# Patient Record
Sex: Female | Born: 1995 | Hispanic: Yes | Marital: Single | Smoking: Never smoker
Health system: Southern US, Community
[De-identification: ages and names within clinical notes are randomized; demographics above are authoritative.]

---

## 2019-10-22 ENCOUNTER — Encounter (HOSPITAL_COMMUNITY): Payer: Self-pay | Admitting: Emergency Medicine

## 2019-10-22 ENCOUNTER — Inpatient Hospital Stay (HOSPITAL_COMMUNITY)
Admission: EM | Admit: 2019-10-22 | Discharge: 2019-10-23 | Disposition: A | Discharge status: Home / Self Care | Payer: BC Managed Care – PPO | Attending: Obstetrics & Gynecology | Admitting: Obstetrics & Gynecology

## 2019-10-22 ENCOUNTER — Other Ambulatory Visit: Payer: Self-pay

## 2019-10-22 DIAGNOSIS — N938 Other specified abnormal uterine and vaginal bleeding: Secondary | ICD-10-CM | POA: Diagnosis not present

## 2019-10-22 DIAGNOSIS — N939 Abnormal uterine and vaginal bleeding, unspecified: Secondary | ICD-10-CM | POA: Diagnosis present

## 2019-10-22 DIAGNOSIS — K5900 Constipation, unspecified: Secondary | ICD-10-CM | POA: Diagnosis not present

## 2019-10-22 DIAGNOSIS — R102 Pelvic and perineal pain: Secondary | ICD-10-CM | POA: Insufficient documentation

## 2019-10-22 DIAGNOSIS — K5904 Chronic idiopathic constipation: Secondary | ICD-10-CM

## 2019-10-22 LAB — URINALYSIS, ROUTINE W REFLEX MICROSCOPIC
Bilirubin Urine: NEGATIVE
Glucose, UA: NEGATIVE mg/dL
Hgb urine dipstick: NEGATIVE
Ketones, ur: NEGATIVE mg/dL
Leukocytes,Ua: NEGATIVE
Nitrite: NEGATIVE
Protein, ur: NEGATIVE mg/dL
Specific Gravity, Urine: 1.025 (ref 1.005–1.030)
pH: 5 (ref 5.0–8.0)

## 2019-10-22 LAB — CBC
HCT: 35.3 % — ABNORMAL LOW (ref 36.0–46.0)
Hemoglobin: 11.2 g/dL — ABNORMAL LOW (ref 12.0–15.0)
MCH: 26.4 pg (ref 26.0–34.0)
MCHC: 31.7 g/dL (ref 30.0–36.0)
MCV: 83.1 fL (ref 80.0–100.0)
Platelets: 286 10*3/uL (ref 150–400)
RBC: 4.25 MIL/uL (ref 3.87–5.11)
RDW: 14.9 % (ref 11.5–15.5)
WBC: 4.9 10*3/uL (ref 4.0–10.5)
nRBC: 0 % (ref 0.0–0.2)

## 2019-10-22 LAB — COMPREHENSIVE METABOLIC PANEL
ALT: 15 U/L (ref 0–44)
AST: 18 U/L (ref 15–41)
Albumin: 4.1 g/dL (ref 3.5–5.0)
Alkaline Phosphatase: 41 U/L (ref 38–126)
Anion gap: 9 (ref 5–15)
BUN: 16 mg/dL (ref 6–20)
CO2: 23 mmol/L (ref 22–32)
Calcium: 9.3 mg/dL (ref 8.9–10.3)
Chloride: 104 mmol/L (ref 98–111)
Creatinine, Ser: 0.55 mg/dL (ref 0.44–1.00)
GFR calc Af Amer: >60 mL/min (ref >60)
GFR calc non Af Amer: >60 mL/min (ref >60)
Glucose, Bld: 92 mg/dL (ref 70–99)
Potassium: 3.9 mmol/L (ref 3.5–5.1)
Sodium: 136 mmol/L (ref 135–145)
Total Bilirubin: 0.7 mg/dL (ref 0.3–1.2)
Total Protein: 7.1 g/dL (ref 6.5–8.1)

## 2019-10-22 LAB — LIPASE, BLOOD: Lipase: 36 U/L (ref 11–51)

## 2019-10-22 LAB — I-STAT BETA HCG BLOOD, ED (MC, WL, AP ONLY): I-stat hCG, quantitative: <5 m[IU]/mL (ref <5)

## 2019-10-22 NOTE — ED Notes (Signed)
  Transport called to take patient to MAU for further treatment.   MAU Charge RN Lowella Bandy called and report given.

## 2019-10-22 NOTE — ED Triage Notes (Signed)
Pt c/o abd pain, difficult going to bathroom and vaginal discharge for the past 2 days.

## 2019-10-22 NOTE — ED Triage Notes (Signed)
Emergency Medicine Provider OB Triage Evaluation Note  Meagan Alexander is a 24 y.o. female, No obstetric history on file., at Unknown gestation who presents to the emergency department with complaints of vaginal bleeding and discharge.  The patient reports a history of constipation, which she reports is chronic.  Reports that her menstrual cycle was earlier this month, but last night she developed some spotting and this morning noted some brown vaginal discharge as well as worsening, crampy pelvic pain.  No fevers, chills, urinary frequency, URI symptoms.  Review of  Systems  Positive: Abdominal pain, pelvic pain, constipation, vaginal bleeding, vaginal discharge Negative: Fever, chills, urinary frequency or hesitancy, cough, shortness of breath  Physical Exam  BP 100/80 (BP Location: Right Arm)    Pulse 68    Temp 98.4 F (36.9 C) (Oral)    Resp 18    Ht 5\' 3"  (1.6 m)    Wt 49.9 kg    SpO2 100%    BMI 19.49 kg/m  General: Awake, no distress  HEENT: Atraumatic  Resp: Normal effort  Cardiac: Normal rate Abd: Nondistended, nontender  MSK: Moves all extremities without difficulty Neuro: Speech clear  Medical Decision Making  Pt evaluated for pregnancy concern and is stable for transfer to MAU. Pt is in agreement with plan for transfer.  11:43 PM Discussed with MAU APP, , who accepts patient in transfer.  Clinical Impression  No diagnosis found.     Wynelle Bourgeois A, PA-C 10/22/19 2343

## 2019-10-23 ENCOUNTER — Encounter (HOSPITAL_COMMUNITY): Payer: Self-pay

## 2019-10-23 DIAGNOSIS — N939 Abnormal uterine and vaginal bleeding, unspecified: Secondary | ICD-10-CM | POA: Diagnosis present

## 2019-10-23 DIAGNOSIS — N938 Other specified abnormal uterine and vaginal bleeding: Secondary | ICD-10-CM

## 2019-10-23 LAB — WET PREP, GENITAL
Clue Cells Wet Prep HPF POC: NONE SEEN
Sperm: NONE SEEN
Trich, Wet Prep: NONE SEEN
Yeast Wet Prep HPF POC: NONE SEEN

## 2019-10-23 LAB — GC/CHLAMYDIA PROBE AMP (~~LOC~~) NOT AT ARMC
Chlamydia: NEGATIVE
Comment: NEGATIVE
Comment: NORMAL
Neisseria Gonorrhea: NEGATIVE

## 2019-10-23 MED ORDER — IBUPROFEN 600 MG PO TABS: 600.0000 mg | ORAL_TABLET | Freq: Four times a day (QID) | ORAL | 0 refills | Status: DC | PRN

## 2019-10-23 MED ORDER — IBUPROFEN 600 MG PO TABS
600.0000 mg | ORAL_TABLET | Freq: Once | ORAL | Status: AC
  Administered 2019-10-23: 600 mg via ORAL
  Filled 2019-10-23: qty 1

## 2019-10-23 MED ORDER — POLYETHYLENE GLYCOL 3350 17 G PO PACK: 17.0000 g | PACK | Freq: Every day | ORAL | 0 refills | Status: AC

## 2019-10-23 NOTE — MAU Provider Note (Signed)
Chief Complaint:  Abdominal Pain   First Provider Initiated Contact with Patient 10/23/19 0030     GYN  HPI: Meagan Alexander is a 24 y.o. No obstetric history on file. who presents to maternity admissions reporting pelvic pain for 2 days, constipation, midcycle bleeding.  Not on contraception.  States last IC was a month ago.  Usually has regular periods. Constipation is chronic.  Marland Kitchen She reports vaginal bleeding, but no vaginal itching/burning, urinary symptoms, h/a, dizziness, n/v, or fever/chills.    Abdominal Pain This is a new problem. The current episode started in the past 7 days. The onset quality is gradual. The problem occurs constantly. The problem has been unchanged. The pain is located in the LLQ, RLQ and suprapubic region. The pain is mild. The quality of the pain is cramping. The abdominal pain does not radiate. Associated symptoms include constipation. Pertinent negatives include no anorexia, diarrhea, dysuria, fever, frequency, myalgias, nausea or vomiting. Nothing aggravates the pain. The pain is relieved by nothing. She has tried nothing for the symptoms.    ED Provider Note: [name] is a 24 y.o. female, No obstetric history on file., at Unknown gestation who presents to the emergency department with complaints of vaginal bleeding and discharge.  The patient reports a history of constipation, which she reports is chronic.  Reports that her menstrual cycle was earlier this month, but last night she developed some spotting and this morning noted some brown vaginal discharge as well as worsening, crampy pelvic pain.  No fevers, chills, urinary frequency, URI symptoms.  RN Note: Pt c/o abd pain, difficult going to bathroom and vaginal discharge for the past 2 days  Past Medical History: History reviewed. No pertinent past medical history.  Past obstetric history: OB History  No obstetric history on file.    Past Surgical History: History reviewed. No  pertinent surgical history.  Family History: No family history on file.  Social History: Social History   Tobacco Use  . Smoking status: Never Smoker  . Smokeless tobacco: Never Used  Vaping Use  . Vaping Use: Never used  Substance Use Topics  . Alcohol use: Never  . Drug use: Never    Allergies: No Known Allergies  Meds:  No medications prior to admission.    I have reviewed patient's Past Medical Hx, Surgical Hx, Family Hx, Social Hx, medications and allergies.  ROS:  Review of Systems  Constitutional: Negative for fever.  Gastrointestinal: Positive for abdominal pain and constipation. Negative for anorexia, diarrhea, nausea and vomiting.  Genitourinary: Negative for dysuria and frequency.  Musculoskeletal: Negative for myalgias.   Other systems negative   Physical Exam   Patient Vitals for the past 24 hrs:  BP Temp Temp src Pulse Resp SpO2 Height Weight  10/23/19 0002 113/74 97.9 F (36.6 C) Oral 68 16 -- -- --  10/22/19 2357 -- -- -- -- -- -- -- 49.9 kg  10/22/19 2238 100/80 -- -- 68 18 100 % -- --  10/22/19 1739 -- -- -- -- -- -- 5\' 3"  (1.6 m) 49.9 kg  10/22/19 1728 109/78 98.4 F (36.9 C) Oral 80 18 100 % 5\' 3"  (1.6 m) 49.9 kg   Constitutional: Well-developed, well-nourished female in no acute distress.  Cardiovascular: normal rate and rhythm Respiratory: normal effort, no distress.  GI: Abd soft, non-tender.  Nondistended.  No rebound, No guarding.   MS: Extremities nontender, no edema, normal ROM Neurologic: Alert and oriented x 4.   Grossly nonfocal. GU:  Neg CVAT. Skin:  Warm and Dry Psych:  Affect appropriate.  PELVIC EXAM: Cervix pink, visually closed, without lesion, scant white creamy discharge, vaginal walls and external genitalia normal Bimanual exam: Cervix firm, anterior, neg CMT, uterus nontender, nonenlarged, adnexa without tenderness, enlargement, or mass  Exam is limited by body habitus.    Labs: Results for orders placed or  performed during the hospital encounter of 10/22/19 (from the past 24 hour(s))  Lipase, blood     Status: None   Collection Time: 10/22/19  5:41 PM  Result Value Ref Range   Lipase 36 11 - 51 U/L  Comprehensive metabolic panel     Status: None   Collection Time: 10/22/19  5:41 PM  Result Value Ref Range   Sodium 136 135 - 145 mmol/L   Potassium 3.9 3.5 - 5.1 mmol/L   Chloride 104 98 - 111 mmol/L   CO2 23 22 - 32 mmol/L   Glucose, Bld 92 70 - 99 mg/dL   BUN 16 6 - 20 mg/dL   Creatinine, Ser 3.41 0.44 - 1.00 mg/dL   Calcium 9.3 8.9 - 96.2 mg/dL   Total Protein 7.1 6.5 - 8.1 g/dL   Albumin 4.1 3.5 - 5.0 g/dL   AST 18 15 - 41 U/L   ALT 15 0 - 44 U/L   Alkaline Phosphatase 41 38 - 126 U/L   Total Bilirubin 0.7 0.3 - 1.2 mg/dL   GFR calc non Af Amer >60 >60 mL/min   GFR calc Af Amer >60 >60 mL/min   Anion gap 9 5 - 15  CBC     Status: Abnormal   Collection Time: 10/22/19  5:41 PM  Result Value Ref Range   WBC 4.9 4.0 - 10.5 K/uL   RBC 4.25 3.87 - 5.11 MIL/uL   Hemoglobin 11.2 (L) 12.0 - 15.0 g/dL   HCT 22.9 (L) 36 - 46 %   MCV 83.1 80.0 - 100.0 fL   MCH 26.4 26.0 - 34.0 pg   MCHC 31.7 30.0 - 36.0 g/dL   RDW 79.8 92.1 - 19.4 %   Platelets 286 150 - 400 K/uL   nRBC 0.0 0.0 - 0.2 %  I-Stat beta hCG blood, ED     Status: None   Collection Time: 10/22/19  6:00 PM  Result Value Ref Range   I-stat hCG, quantitative <5.0 <5 mIU/mL   Comment 3          Urinalysis, Routine w reflex microscopic Urine, Clean Catch     Status: None   Collection Time: 10/22/19  7:53 PM  Result Value Ref Range   Color, Urine YELLOW YELLOW   APPearance CLEAR CLEAR   Specific Gravity, Urine 1.025 1.005 - 1.030   pH 5.0 5.0 - 8.0   Glucose, UA NEGATIVE NEGATIVE mg/dL   Hgb urine dipstick NEGATIVE NEGATIVE   Bilirubin Urine NEGATIVE NEGATIVE   Ketones, ur NEGATIVE NEGATIVE mg/dL   Protein, ur NEGATIVE NEGATIVE mg/dL   Nitrite NEGATIVE NEGATIVE   Leukocytes,Ua NEGATIVE NEGATIVE  Wet prep, genital      Status: Abnormal   Collection Time: 10/23/19 12:26 AM   Specimen: Vaginal  Result Value Ref Range   Yeast Wet Prep HPF POC NONE SEEN NONE SEEN   Trich, Wet Prep NONE SEEN NONE SEEN   Clue Cells Wet Prep HPF POC NONE SEEN NONE SEEN   WBC, Wet Prep HPF POC MANY (A) NONE SEEN   Sperm NONE SEEN    Imaging:  No  results found.  MAU Course/MDM: I have ordered labs as follows:  GC/Chlamydia, Wet prep, UA  All are negative for infection Imaging ordered: none indicated Results reviewed.    Treatments in MAU included ibuprofen for cramping  Discussed the irregular bleeding and cramping are likely result of an anovulatory cycle or less than optimal egg last cycle.  Pain can also be related to chronic constipation.  Recommend waiting 1-2 more cycles to see if normal cycles resume Miralax and daily fiber for constipation .   Pt stable at time of discharge.  Assessment: Pelvic pain Constipation Dysfunctional Uterine bleeding  Plan: Discharge home Recommend ibuprofen for pain and to help bleeding, miralax for constipation.  Discussed cycles will likely regulate themselves without intervention Discussed she can go to HD or GYN for contraception Rx sent for ibuprofen  for pain Rx Miralax for constipation Recommend daily fiber  Encouraged to return here or to other Urgent Care/ED if she develops worsening of symptoms, increase in pain, fever, or other concerning symptoms.   Wynelle Bourgeois CNM, MSN Certified Nurse-Midwife 10/23/2019 12:30 AM

## 2019-10-23 NOTE — MAU Note (Signed)
.   Meagan Alexander is a 24 y.o. GYN here in MAU reporting: abdominal pain that started yesterday. Patient states that she has a lot of constipation issues. She states she had a period x2 weeks ago but also started spotting again last night. Her period is usually very regular so when she had an increase in vaginal discharge and bleeding she wanted to be sure everything was fine.   Pain score: 4 Vitals:   10/22/19 2238 10/23/19 0002  BP: 100/80 113/74  Pulse: 68 68  Resp: 18 16  Temp:  97.9 F (36.6 C)  SpO2: 100%

## 2019-10-23 NOTE — Discharge Instructions (Signed)
Abnormal Uterine Bleeding Abnormal uterine bleeding means bleeding more than usual from your uterus. It can include:  Bleeding between periods.  Bleeding after sex.  Bleeding that is heavier than normal.  Periods that last longer than usual.  Bleeding after you have stopped having your period (menopause). There are many problems that may cause this. You should see a doctor for any kind of bleeding that is not normal. Treatment depends on the cause of the bleeding. Follow these instructions at home:  Watch your condition for any changes.  Do not use tampons, douche, or have sex, if your doctor tells you not to.  Change your pads often.  Get regular well-woman exams. Make sure they include a pelvic exam and cervical cancer screening.  Keep all follow-up visits as told by your doctor. This is important. Contact a doctor if:  The bleeding lasts more than one week.  You feel dizzy at times.  You feel like you are going to throw up (nauseous).  You throw up. Get help right away if:  You pass out.  You have to change pads every hour.  You have belly (abdominal) pain.  You have a fever.  You get sweaty.  You get weak.  You passing large blood clots from your vagina. Summary  Abnormal uterine bleeding means bleeding more than usual from your uterus.  There are many problems that may cause this. You should see a doctor for any kind of bleeding that is not normal.  Treatment depends on the cause of the bleeding. This information is not intended to replace advice given to you by your health care provider. Make sure you discuss any questions you have with your health care provider. Document Revised: 01/03/2016 Document Reviewed: 01/03/2016 Elsevier Patient Education  2020 [employer]   Chronic Constipation  Chronic constipation is a condition in which a person has three or fewer bowel movements a week, for three months or longer. This condition is especially common  in older adults. The two main kinds of chronic constipation are secondary constipation and functional constipation. Secondary constipation results from another condition or a treatment. Functional constipation, also called primary or idiopathic constipation, is divided into three types:  Normal transit constipation. In this type, movement of stool through the colon (stool transit) occurs normally.  Slow transit constipation. In this type, stool moves slowly through the colon.  Outlet constipation or pelvic floor dysfunction. In this type, the nerves and muscles that empty the rectum do not work normally. What are the causes? Causes of secondary constipation may include:  Failing to drink enough fluid, eat enough food or fiber, or get physically active.  Pregnancy.  A tear in the anus (anal fissure).  Blockage in the bowel (bowel obstruction).  Narrowing of the bowel (bowel stricture).  Having a long-term medical condition, such as: ? Diabetes. ? Hypothyroidism. ? Multiple sclerosis. ? Parkinson disease. ? Stroke. ? Spinal cord injury. ? Dementia. ? Colon cancer. ? Inflammatory bowel disease (IBD). ? Iron-deficiency anemia. ? Outward collapse of the rectum (rectal prolapse). ? Hemorrhoids.  Taking certain medicines, including: ? Narcotics. These are a certain type of prescription pain medicine. ? Antacids. ? Iron supplements. ? Water pills (diuretics). ? Certain blood pressure medicines. ? Anti-seizure medicines. ? Antidepressants. ? Medicines for Parkinson disease. The cause of functional constipation is not known, but some conditions are associated with it. These conditions include:  Stress.  Problems in the nerves and muscles that control stool transit.  Weak or impaired  pelvic floor muscles. What increases the risk? You may be at higher risk for chronic constipation if you:  Are older than age 73.  Are female.  Live in a long-term care facility.  Do not  get much exercise or physical activity (have a sedentary lifestyle).  Do not drink enough fluids.  Do not eat enough food, especially fiber.  Have a long-term disease.  Have a mental health disorder or eating disorder.  Take many medicines. What are the signs or symptoms? The main symptom of chronic constipation is having three or fewer bowel movements a week for several weeks. Other signs and symptoms may vary from person to person. These include:  Pushing hard (straining) to pass stool.  Painful bowel movements.  Having hard or lumpy stools.  Having lower belly discomfort, such as cramps or bloating.  Being unable to have a bowel movement when you feel the urge.  Feeling like you still need to pass stool after a bowel movement.  Feeling that you have something in your rectum that is blocking or preventing bowel movements.  Seeing blood on the toilet paper or in your stool.  Worsening confusion (in older adults). How is this diagnosed? This condition may be diagnosed based on:  Symptoms and medical history. You will be asked about your symptoms, lifestyle, diet, and any medicines that you are taking.  Physical exam. ? Your belly (abdomen) will be examined. ? A digital rectal exam may be done. For this exam, a health care provider places a lubricated, gloved finger into the rectum.  Other tests to check for any underlying causes of your constipation. These may be ordered if you have bleeding in your rectum, weight loss, or a family history of colon cancer. In these cases, you may have: ? Imaging studies of the colon. These may include X-ray, ultrasound, or CT scan. ? Blood tests. ? A procedure to examine the inside of your colon (colonoscopy). ? More specialized tests to check:  Whether your anal sphincter works well. This is a ring-shaped muscle that controls the closing of the anus.  How well food moves through your colon. ? Tests to measure the nerve signal in your  pelvic floor muscles (electromyography). How is this treated? Treatment for chronic constipation depends on the cause. Most often, treatment starts with:  Being more active and getting regular exercise.  Drinking more fluids.  Adding fiber to your diet. Sources of fiber include fruits, vegetables, whole grains, and fiber supplements.  Using medicines such as stool softeners or medicines that increase contractions in your digestive system (pro-motility agents).  Training your pelvic muscles with biofeedback.  Surgery, if there is obstruction. Treatment for secondary chronic constipation depends on the underlying condition. You may need to:  Stop or change some medicines if they cause constipation.  Use a fiber supplement (bulk laxative) or stool softener.  Use prescription laxative. This works by [employer] into your colon (osmotic laxative). You may also need to see a specialist who treats conditions of the digestive system (gastroenterologist). Follow these instructions at home:   Take over-the-counter and prescription medicines only as told by your health care provider.  If you are taking a laxative, take it as told by your health care provider.  Eat a balanced diet that includes enough fiber. Ask your health care provider to recommend a diet that is right for you.  Drink clear fluids, especially water. Avoid drinking alcohol, caffeine, and soda.  Drink enough fluid to keep your urine  pale yellow.  Get some physical activity every day. Ask your health care provider what physical activities are safe for you.  Get colon cancer screenings as told by your health care provider.  Keep all follow-up visits as told by your health care provider. This is important. Contact a health care provider if:  You are having three or fewer bowel movements a week.  Your stools are hard or lumpy.  You notice blood on the toilet paper or in your stool after you have a bowel  movement.  You have unexplained weight loss.  You have rectum (rectal) pain.  You have stool leakage.  You experience nausea or vomiting. Get help right away if:  You have rectal bleeding or you pass blood clots.  You have severe rectal pain.  You have body tissue that pushes out (protrudes) from your anus.  You have severe pain or bloating (distension) in your abdomen.  You have vomiting that you cannot control. Summary  Chronic constipation is a condition in which a person has three or fewer bowel movements a week, for three months or longer.  You may have a higher risk for this condition if you are an older adult, or if you do not drink enough water or get enough physical activity (are sedentary).  Treatment for this condition depends on the cause. Most treatments for chronic constipation include adding fiber to your diet, drinking more fluids, and getting more physical activity. You may also need to treat any underlying medical conditions or stop or change certain medicines if they cause constipation.  If lifestyle changes do not relieve constipation, your health care provider may recommend taking a laxative. This information is not intended to replace advice given to you by your health care provider. Make sure you discuss any questions you have with your health care provider. Document Revised: 12/21/2016 Document Reviewed: 09/25/2016 Elsevier Patient Education  2020 [employer].

## 2020-06-29 ENCOUNTER — Other Ambulatory Visit: Payer: Self-pay

## 2020-06-29 ENCOUNTER — Emergency Department (HOSPITAL_COMMUNITY): Payer: BC Managed Care – PPO

## 2020-06-29 ENCOUNTER — Encounter (HOSPITAL_COMMUNITY): Payer: Self-pay | Admitting: Emergency Medicine

## 2020-06-29 ENCOUNTER — Emergency Department (HOSPITAL_COMMUNITY)
Admission: EM | Admit: 2020-06-29 | Discharge: 2020-06-30 | Disposition: A | Discharge status: Home / Self Care | Payer: BC Managed Care – PPO | Attending: Emergency Medicine | Admitting: Emergency Medicine

## 2020-06-29 DIAGNOSIS — R102 Pelvic and perineal pain: Secondary | ICD-10-CM | POA: Diagnosis present

## 2020-06-29 DIAGNOSIS — R112 Nausea with vomiting, unspecified: Secondary | ICD-10-CM | POA: Insufficient documentation

## 2020-06-29 DIAGNOSIS — R42 Dizziness and giddiness: Secondary | ICD-10-CM

## 2020-06-29 LAB — CBC
HCT: 38 % (ref 36.0–46.0)
Hemoglobin: 12 g/dL (ref 12.0–15.0)
MCH: 27.1 pg (ref 26.0–34.0)
MCHC: 31.6 g/dL (ref 30.0–36.0)
MCV: 86 fL (ref 80.0–100.0)
Platelets: 294 10*3/uL (ref 150–400)
RBC: 4.42 MIL/uL (ref 3.87–5.11)
RDW: 14.8 % (ref 11.5–15.5)
WBC: 6.8 10*3/uL (ref 4.0–10.5)
nRBC: 0 % (ref 0.0–0.2)

## 2020-06-29 LAB — URINALYSIS, ROUTINE W REFLEX MICROSCOPIC
Bacteria, UA: NONE SEEN
Bilirubin Urine: NEGATIVE
Glucose, UA: NEGATIVE mg/dL
Ketones, ur: NEGATIVE mg/dL
Nitrite: NEGATIVE
Protein, ur: NEGATIVE mg/dL
Specific Gravity, Urine: 1.025 (ref 1.005–1.030)
pH: 7 (ref 5.0–8.0)

## 2020-06-29 LAB — I-STAT BETA HCG BLOOD, ED (MC, WL, AP ONLY): I-stat hCG, quantitative: <5 m[IU]/mL (ref <5)

## 2020-06-29 LAB — COMPREHENSIVE METABOLIC PANEL
ALT: 11 U/L (ref 0–44)
AST: 16 U/L (ref 15–41)
Albumin: 4.3 g/dL (ref 3.5–5.0)
Alkaline Phosphatase: 38 U/L (ref 38–126)
Anion gap: 6 (ref 5–15)
BUN: 20 mg/dL (ref 6–20)
CO2: 24 mmol/L (ref 22–32)
Calcium: 9 mg/dL (ref 8.9–10.3)
Chloride: 107 mmol/L (ref 98–111)
Creatinine, Ser: 0.86 mg/dL (ref 0.44–1.00)
GFR, Estimated: >60 mL/min (ref >60)
Glucose, Bld: 99 mg/dL (ref 70–99)
Potassium: 3.7 mmol/L (ref 3.5–5.1)
Sodium: 137 mmol/L (ref 135–145)
Total Bilirubin: 0.4 mg/dL (ref 0.3–1.2)
Total Protein: 7.3 g/dL (ref 6.5–8.1)

## 2020-06-29 LAB — LIPASE, BLOOD: Lipase: 42 U/L (ref 11–51)

## 2020-06-29 MED ORDER — ONDANSETRON HCL 4 MG/2ML IJ SOLN
4.0000 mg | Freq: Once | INTRAMUSCULAR | Status: AC
  Administered 2020-06-29: 4 mg via INTRAVENOUS

## 2020-06-29 MED ORDER — SODIUM CHLORIDE 0.9 % IV BOLUS
1000.0000 mL | Freq: Once | INTRAVENOUS | Status: AC
  Administered 2020-06-29: 1000 mL via INTRAVENOUS

## 2020-06-29 NOTE — ED Provider Notes (Signed)
[facility]-EMERGENCY DEPT Provider Note   CSN: [id number] Arrival date & time: 06/29/20  2045     History Chief Complaint  Patient presents with   Abdominal Pain    Meagan Alexander is a 25 y.o. female.  Patient presents to the ED with a chief complaint of pelvic pain and dizziness.  She states that she began her cycle today.  She reports light bleeding.  She reports associated nausea and vomiting.  She states that she has never experienced dizziness/lightheadedness like this before with her cycle.  She reports moderate pelvic pain.  She states this is worse than normal.  She denies any treatment prior to arrival.  The history is provided by the patient. No language interpreter was used.      History reviewed. No pertinent past medical history.  Patient Active Problem List   Diagnosis Date Noted   Abnormal uterine bleeding (AUB) 10/23/2019    History reviewed. No pertinent surgical history.   OB History   No obstetric history on file.     History reviewed. No pertinent family history.  Social History   Tobacco Use   Smoking status: Never Smoker   Smokeless tobacco: Never Used  Vaping Use   Vaping Use: Never used  Substance Use Topics   Alcohol use: Never   Drug use: Never    Home Medications Prior to Admission medications   Medication Sig Start Date End Date Taking? Authorizing Provider  ibuprofen (ADVIL) 600 MG tablet Take 1 tablet (600 mg total) by mouth every 6 (six) hours as needed. 10/23/19   Aviva Signs, CNM  polyethylene glycol (MIRALAX) 17 g packet Take 17 g by mouth daily. 10/23/19   Aviva Signs, CNM    Allergies    Patient has no known allergies.  Review of Systems   Review of Systems  Gastrointestinal:  Positive for abdominal pain.  All other systems reviewed and are negative.  Physical Exam Updated Vital Signs BP 105/80 (BP Location: Left Arm)   Pulse 65   Temp 99.5 F (37.5 C) (Oral)   Resp 16    Ht 5\' 3"  (1.6 m)   Wt 51.1 kg   LMP 06/29/2020   SpO2 100%   BMI 19.96 kg/m   Physical Exam Vitals and nursing note reviewed.  Constitutional:      General: She is not in acute distress.    Appearance: She is well-developed.  HENT:     Head: Normocephalic and atraumatic.  Eyes:     Conjunctiva/sclera: Conjunctivae normal.  Cardiovascular:     Rate and Rhythm: Normal rate and regular rhythm.     Heart sounds: No murmur heard. Pulmonary:     Effort: Pulmonary effort is normal. No respiratory distress.     Breath sounds: Normal breath sounds.  Abdominal:     Palpations: Abdomen is soft.     Tenderness: There is abdominal tenderness.     Comments: Moderate suprapubic tenderness  Musculoskeletal:        General: Normal range of motion.     Cervical back: Neck supple.  Skin:    General: Skin is warm and dry.  Neurological:     Mental Status: She is alert.  Psychiatric:        Mood and Affect: Mood normal.        Behavior: Behavior normal.    ED Results / Procedures / Treatments   Labs (all labs ordered are listed, but only abnormal results are displayed)  Labs Reviewed  URINALYSIS, ROUTINE W REFLEX MICROSCOPIC - Abnormal; Notable for the following components:      Result Value   APPearance HAZY (*)    Hgb urine dipstick MODERATE (*)    Leukocytes,Ua TRACE (*)    All other components within normal limits  LIPASE, BLOOD  COMPREHENSIVE METABOLIC PANEL  CBC  I-STAT BETA HCG BLOOD, ED ([facility], [facility], [facility] ONLY)    EKG None  Radiology No results found.  Procedures Procedures   Medications Ordered in ED Medications  sodium chloride 0.9 % bolus 1,000 mL (has no administration in time range)  ondansetron (ZOFRAN) injection 4 mg (has no administration in time range)    ED Course  I have reviewed the triage vital signs and the nursing notes.  Pertinent labs & imaging results that were available during my care of the patient were reviewed by me and considered in my medical  decision making (see chart for details).    MDM Rules/Calculators/A&P                          Patient here with pelvic pain.  She just started her menstrual cycle.  She also reports feeling somewhat dizzy.  She is not hypotensive.  She is not anemic.  She is not pregnant.  Glucose is normal.  Laboratory work-up is reassuring.  Ultrasound of the pelvis shows no acute abnormality.  She does not appear to be in any acute distress.  I do not feel that she requires any further emergent work-up or treatment in the emergency department tonight.  Feel that she is stable for discharge and close follow-up with her PCP/OB/GYN.  She feels improved after receiving a liter of fluid.   Final Clinical Impression(s) / ED Diagnoses Final diagnoses:  Pelvic pain in female  Dizziness    Rx / DC Orders ED Discharge Orders     None        Roxy Horseman, PA-C 06/30/20 0973    Geoffery Lyons, MD 06/30/20 [phone]

## 2020-06-29 NOTE — ED Triage Notes (Signed)
Patient complaining of lower abdominal pain and dizziness that started today.

## 2020-08-02 ENCOUNTER — Other Ambulatory Visit: Payer: Self-pay

## 2020-08-02 ENCOUNTER — Emergency Department (HOSPITAL_COMMUNITY)
Admission: EM | Admit: 2020-08-02 | Discharge: 2020-08-03 | Disposition: A | Discharge status: Home / Self Care | Payer: BC Managed Care – PPO | Attending: Emergency Medicine | Admitting: Emergency Medicine

## 2020-08-02 ENCOUNTER — Encounter (HOSPITAL_COMMUNITY): Payer: Self-pay | Admitting: Emergency Medicine

## 2020-08-02 DIAGNOSIS — B3731 Acute candidiasis of vulva and vagina: Secondary | ICD-10-CM

## 2020-08-02 DIAGNOSIS — R102 Pelvic and perineal pain: Secondary | ICD-10-CM | POA: Insufficient documentation

## 2020-08-02 DIAGNOSIS — B373 Candidiasis of vulva and vagina: Secondary | ICD-10-CM | POA: Insufficient documentation

## 2020-08-02 LAB — PREGNANCY, URINE: Preg Test, Ur: NEGATIVE

## 2020-08-02 MED ORDER — IBUPROFEN 200 MG PO TABS
600.0000 mg | ORAL_TABLET | Freq: Once | ORAL | Status: DC
Start: 2020-08-03 — End: 2020-08-03

## 2020-08-02 NOTE — ED Triage Notes (Signed)
Patient presents with pelvic pain that began Saturday. Patient is on a course of macrobid for a UTI.

## 2020-08-02 NOTE — ED Provider Notes (Signed)
Emergency Medicine Provider Triage Evaluation Note  Meagan Alexander , a 25 y.o. female  was evaluated in triage.  Pt complains of pelvic pain.  This has been going on for a week.  She has been taking Macrobid for UTI.  Has not taken any other medications for pain.  Review of Systems  Positive: Pelvic pain Negative: Vaginal bleeding, vaginal discharge, fever, vomiting, dysuria  Physical Exam  BP 100/65 (BP Location: Right Arm)   Pulse 78   Temp 98.5 F (36.9 C) (Oral)   Resp 18   Ht 5\' 3"  (1.6 m)   Wt 52.2 kg   SpO2 99%   BMI 20.37 kg/m  Gen:   Awake, no distress   Resp:  Normal effort  MSK:   Moves extremities without difficulty  Other:  Abdomen is soft  Medical Decision Making  Medically screening exam initiated at 11:26 PM.  Appropriate orders placed.  Meagan Alexander was informed that the remainder of the evaluation will be completed by another provider, this initial triage assessment does not replace that evaluation, and the importance of remaining in the ED until their evaluation is complete.  Will check pregnancy test will likely need pain control   Therese Sarah, PA-C 08/02/20 2327    [id number], MD 08/02/20 [phone]

## 2020-08-03 ENCOUNTER — Emergency Department (HOSPITAL_COMMUNITY): Payer: BC Managed Care – PPO

## 2020-08-03 ENCOUNTER — Encounter (HOSPITAL_COMMUNITY): Payer: Self-pay | Admitting: Student

## 2020-08-03 LAB — WET PREP, GENITAL
Sperm: NONE SEEN
Trich, Wet Prep: NONE SEEN

## 2020-08-03 LAB — URINALYSIS, ROUTINE W REFLEX MICROSCOPIC
Bilirubin Urine: NEGATIVE
Glucose, UA: NEGATIVE mg/dL
Ketones, ur: NEGATIVE mg/dL
Nitrite: NEGATIVE
Protein, ur: NEGATIVE mg/dL
Specific Gravity, Urine: 1.011 (ref 1.005–1.030)
pH: 5 (ref 5.0–8.0)

## 2020-08-03 LAB — GC/CHLAMYDIA PROBE AMP (~~LOC~~) NOT AT ARMC
Chlamydia: NEGATIVE
Comment: NEGATIVE
Comment: NORMAL
Neisseria Gonorrhea: NEGATIVE

## 2020-08-03 MED ORDER — NAPROXEN 375 MG PO TABS
375.0000 mg | ORAL_TABLET | Freq: Once | ORAL | Status: AC
  Administered 2020-08-03: 375 mg via ORAL
  Filled 2020-08-03: qty 1

## 2020-08-03 MED ORDER — NAPROXEN 500 MG PO TABS: 500.0000 mg | ORAL_TABLET | Freq: Two times a day (BID) | ORAL | 0 refills | Status: AC | PRN

## 2020-08-03 MED ORDER — FLUCONAZOLE 150 MG PO TABS
150.0000 mg | ORAL_TABLET | Freq: Once | ORAL | Status: AC
  Administered 2020-08-03: 150 mg via ORAL
  Filled 2020-08-03: qty 1

## 2020-08-03 MED ORDER — KETOROLAC TROMETHAMINE 15 MG/ML IJ SOLN: 15.0000 mg | Freq: Once | INTRAMUSCULAR | Status: DC

## 2020-08-03 NOTE — ED Provider Notes (Signed)
[facility]-EMERGENCY DEPT Provider Note   CSN: [id number] Arrival date & time: 08/02/20  2221     History Chief Complaint  Patient presents with   Pelvic Pain    Meagan Alexander is a 25 y.o. female who presents to the ED with complaints of pelvic pain for the past 6 days.  Patient states that she has had intermittent pelvic cramping for the past 6 days, no specific triggers or alleviating or aggravating factors.  She was seen at urgent care and was told she had a UTI, she was placed on Macrobid which she has been taking without improvement in her symptoms.  She recently started birth control.  She is currently sexually active with 1 partner without concern for STI.  She denies fever, nausea, vomiting, atypical vaginal discharge, vaginal bleeding, dysuria, frequency, urgency, or diarrhea.  HPI     History reviewed. No pertinent past medical history.  Patient Active Problem List   Diagnosis Date Noted   Abnormal uterine bleeding (AUB) 10/23/2019    History reviewed. No pertinent surgical history.   OB History   No obstetric history on file.     No family history on file.  Social History   Tobacco Use   Smoking status: Never   Smokeless tobacco: Never  Vaping Use   Vaping Use: Never used  Substance Use Topics   Alcohol use: Never   Drug use: Never    Home Medications Prior to Admission medications   Medication Sig Start Date End Date Taking? Authorizing Provider  ibuprofen (ADVIL) 600 MG tablet Take 1 tablet (600 mg total) by mouth every 6 (six) hours as needed. 10/23/19   Aviva Signs, CNM  polyethylene glycol (MIRALAX) 17 g packet Take 17 g by mouth daily. 10/23/19   Aviva Signs, CNM    Allergies    Patient has no known allergies.  Review of Systems   Review of Systems Constitutional:  Negative for chills and fever. Respiratory:  Negative for shortness of breath.   Cardiovascular:  Negative for chest  pain. Gastrointestinal:  Negative for diarrhea, nausea and vomiting. Genitourinary:  Positive for pelvic pain. Negative for dysuria, vaginal bleeding and vaginal discharge.  All other systems reviewed and are negative. Physical Exam Updated Vital Signs BP 100/65 (BP Location: Right Arm)   Pulse 78   Temp 98.5 F (36.9 C) (Oral)   Resp 18   Ht 5\' 3"  (1.6 m)   Wt 52.2 kg   SpO2 99%   BMI 20.37 kg/m   Physical Exam Vitals and nursing note reviewed. Constitutional:      General: She is not in acute distress.    Appearance: She is well-developed. She is not toxic-appearing. HENT:    Head: Normocephalic and atraumatic. Eyes:    General:        Right eye: No discharge.        Left eye: No discharge.    Conjunctiva/sclera: Conjunctivae normal. Cardiovascular:    Rate and Rhythm: Normal rate and regular rhythm. Pulmonary:    Effort: Pulmonary effort is normal. No respiratory distress.    Breath sounds: Normal breath sounds. No wheezing, rhonchi or rales. Abdominal:    General: There is no distension.    Palpations: Abdomen is soft.    Tenderness: There is no abdominal tenderness. There is no guarding or rebound. Genitourinary:    Comments: NP present as chaperone.  No external lesions.  There is white to yellow-colored discharge present in the vaginal  canal.  Diffuse tenderness with bimanual exam, no focal cervical motion tenderness.  No adnexal masses palpated Musculoskeletal:    Cervical back: Neck supple. Skin:    General: Skin is warm and dry.    Findings: No rash. Neurological:    Mental Status: She is alert.    Comments: Clear speech.   Psychiatric:        Behavior: Behavior normal.   ED Results / Procedures / Treatments   Labs (all labs ordered are listed, but only abnormal results are displayed) Labs Reviewed  WET PREP, GENITAL  PREGNANCY, URINE  GC/CHLAMYDIA PROBE AMP ([place]) NOT AT [facility]    EKG None  Radiology No results  found.  Procedures Procedures   Medications Ordered in ED Medications  ibuprofen (ADVIL) tablet 600 mg (has no administration in time range)    ED Course  I have reviewed the triage vital signs and the nursing notes.  Pertinent labs & imaging results that were available during my care of the patient were reviewed by me and considered in my medical decision making (see chart for details).    MDM Rules/Calculators/A&P                          Patient presents to the ED with complaints of pelvic pain.  Resting comfortably, vitals without significant abnormality.  On exam patient does have diffuse tenderness with bimanual, no specific cervical motion tenderness, no concern for STD by patient feel PID is less likely.    Additional history obtained: Additional history obtained from chart review & nursing note review.   Lab Tests: I Ordered, reviewed, and interpreted labs, which included:  Pregnancy test: Negative Wet prep: Yeast present as well as clue cells. Urinalysis: Large leukocytes, however rare bacteria and nitrite negative, given she is currently on antibiotics will send for culture for further assessment.   Imaging Studies ordered: I ordered imaging studies which included pelvic ultrasound, I independently reviewed, formal radiology impression shows:  Unremarkable pelvic ultrasound.    ED Course:  Pregnancy test is negative therefore doubt ectopic pregnancy or pain with IUP. Ultrasound without torsion or ovarian cyst. Urinalysis with large leukocytes, however rare bacteria, nitrite negative, no urinary symptoms and currently on antibiotics do not feel this needs adjustment at this time. Wet prep: Yeast present,- diflucan ordered for tx, there are clue cells, however patient without atypical vaginal discharge.  Unclear definitive etiology, repeat abdominal exam remains peritoneal signs, low suspicion for acute surgical process, will trial NSAID for intermittent cramping with  OB/GYN follow-up. I discussed results, treatment plan, need for follow-up, and return precautions with the patient. Provided opportunity for questions, patient confirmed understanding and is in agreement with plan.    Portions of this note were generated with Scientist, clinical (histocompatibility and immunogenetics). Dictation errors may occur despite best attempts at proofreading.  Final Clinical Impression(s) / ED Diagnoses Final diagnoses:  Pelvic pain in female  Yeast vaginitis    Rx / DC Orders ED Discharge Orders          Ordered    naproxen (NAPROSYN) 500 MG tablet  2 times daily PRN        08/03/20 0542             Cherly Anderson, PA-C 08/03/20 0650    Zadie Rhine, MD 08/03/20 [phone]

## 2020-08-03 NOTE — Discharge Instructions (Addendum)
You were seen in the emergency department today for pelvic pain.  Your ultrasound was normal.  Your pregnancy test was negative.  Your wet prep (pelvic sample) showed findings of yeast, you were given a dose of Diflucan which is an antifungal medicine to treat this in the emergency department.  We are sending you home with naproxen to help with pain.  - Naproxen is a nonsteroidal anti-inflammatory medication that will help with pain and swelling. Be sure to take this medication as prescribed with food, 1 pill every 12 hours,  It should be taken with food, as it can cause stomach upset, and more seriously, stomach bleeding. Do not take other nonsteroidal anti-inflammatory medications with this such as Advil, Motrin, Aleve, Mobic, Goodie Powder, or Motrin.     You make take Tylenol per over the counter dosing with these medications.   We have prescribed you new medication(s) today. Discuss the medications prescribed today with your pharmacist as they can have adverse effects and interactions with your other medicines including over the counter and prescribed medications. Seek medical evaluation if you start to experience new or abnormal symptoms after taking one of these medicines, seek care immediately if you start to experience difficulty breathing, feeling of your throat closing, facial swelling, or rash as these could be indications of a more serious allergic reaction  We would like you to follow-up with OB/GYN within 1 week.  Return to the ER for new or worsening symptoms including but not limited to new or worsening pain, fever, inability to keep fluids down, or any other concerns.

## 2020-08-04 LAB — URINE CULTURE: Culture: 30000 — AB

## 2020-08-05 ENCOUNTER — Telehealth: Payer: Self-pay | Admitting: Emergency Medicine

## 2020-08-05 NOTE — Telephone Encounter (Signed)
Post ED Visit - Positive Culture Follow-up  Culture report reviewed by antimicrobial stewardship pharmacist: [facility] []  , Pharm.D. []  Enzo Bi, Pharm.D., BCPS AQ-ID []  , Pharm.D., BCPS []  Celedonio Miyamoto, Pharm.D., BCPS []  [place], Garvin Fila.D., BCPS, AAHIVP []  , Pharm.D., BCPS, AAHIVP []  Georgina Pillion, PharmD, BCPS []  , PharmD, BCPS []  [place], PharmD, BCPS []  [address], PharmD []  , PharmD, BCPS []  Estella Husk, PharmD  Pharmacy Team []  Lysle Pearl, PharmD []  , PharmD []  Phillips Climes, PharmD []  , Rph []  Agapito Games) , PharmD []  Verlan Friends, PharmD []  , PharmD []  Mervyn Gay, PharmD []  , PharmD []  Vinnie Level, PharmD []  [facility], PharmD []  , PharmD [x]  Len Childs, PharmD   Positive urine culture Treated with Fluconazole, organism sensitive to the same and no further patient follow-up is required at this time.  Aury Scollard 08/05/2020, 12:54 PM

## 2021-10-12 IMAGING — US US PELVIS COMPLETE
1 series · 15 of 25 positions shown · non-contrast
Comparison: None available.

CLINICAL DATA: Initial evaluation for acute pelvic pain.

EXAM:
TRANSABDOMINAL AND TRANSVAGINAL ULTRASOUND OF PELVIS
DOPPLER ULTRASOUND OF OVARIES
TECHNIQUE: Both transabdominal and transvaginal ultrasound examinations of the
pelvis were performed. Transabdominal technique was performed for
global imaging of the pelvis including uterus, ovaries, adnexal
regions, and pelvic cul-de-sac.
It was necessary to proceed with endovaginal exam following the
transabdominal exam to visualize the uterus, endometrium, and
ovaries. Color and duplex Doppler ultrasound was utilized to
evaluate blood flow to the ovaries.

[Series 1: us pelvis complete mc & wl · 15 of 72 slices shown]
[im 1/72]
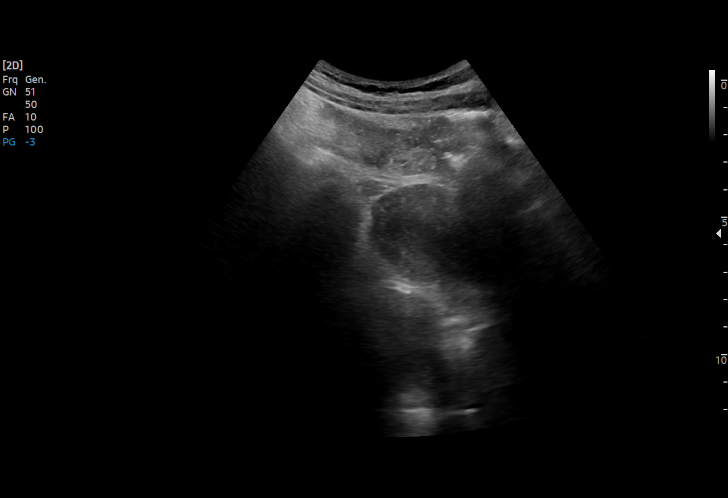
[im 6/72]
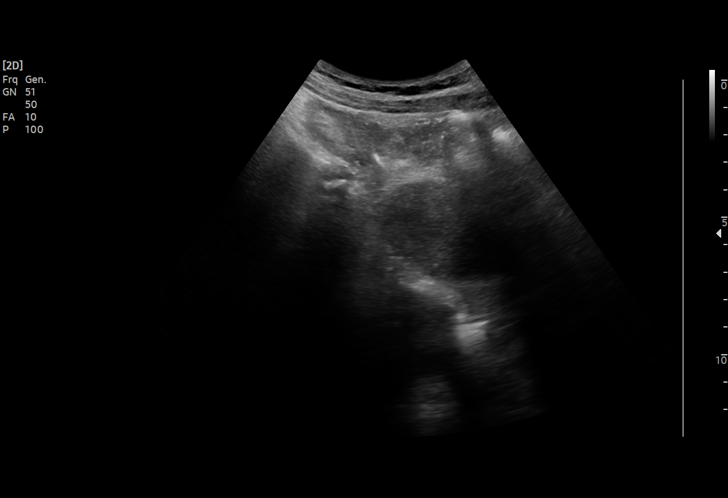
[im 12/72]
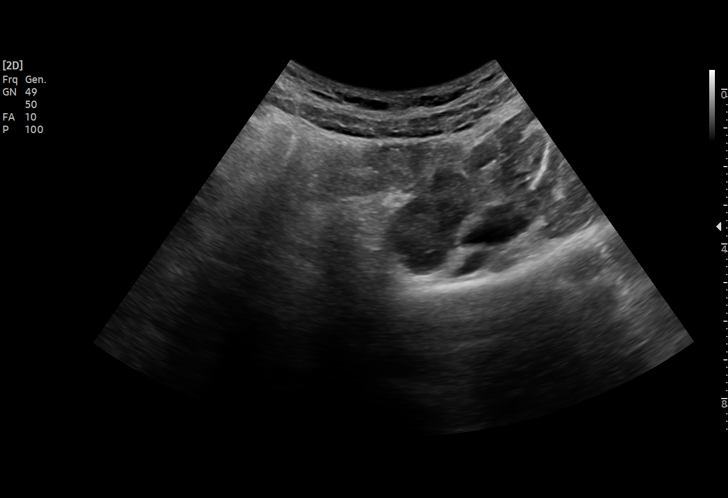
[im 15/72]
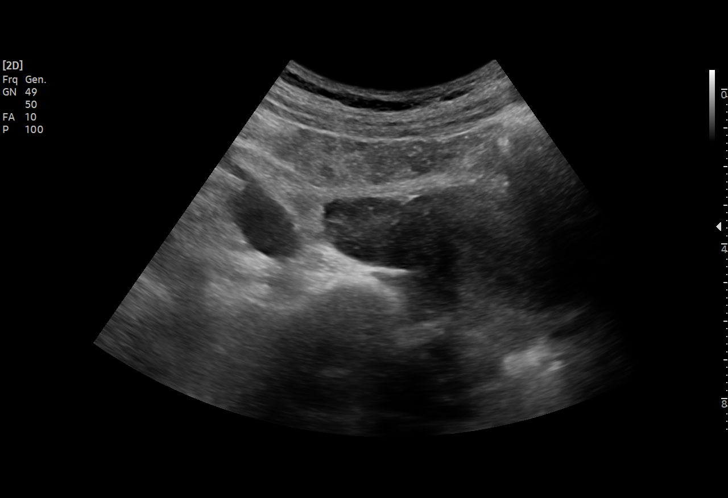
[im 21/72]
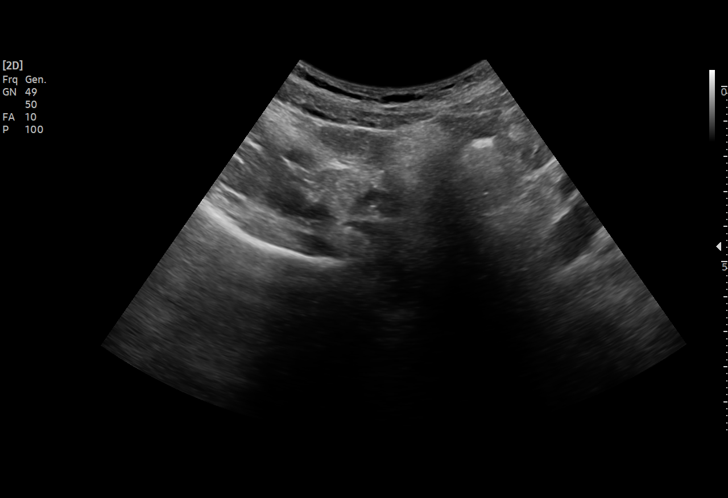
[im 27/72]
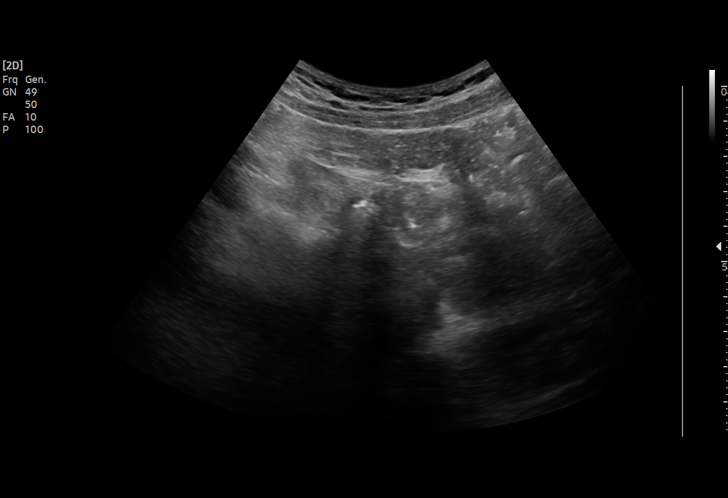
[im 30/72]
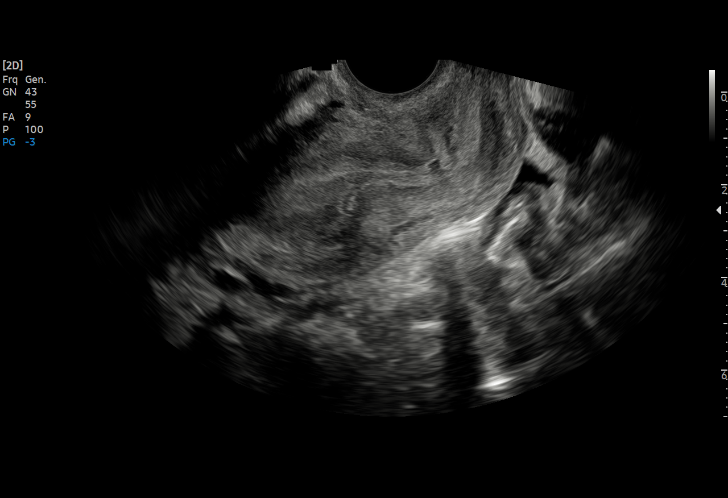
[im 36/72]
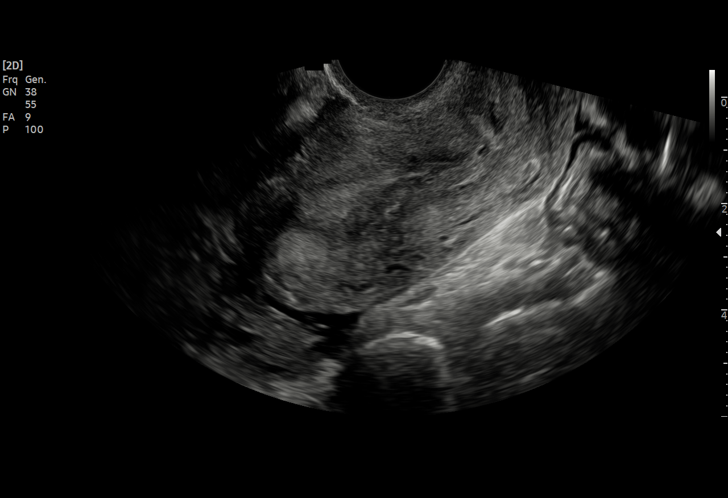
[im 42/72]
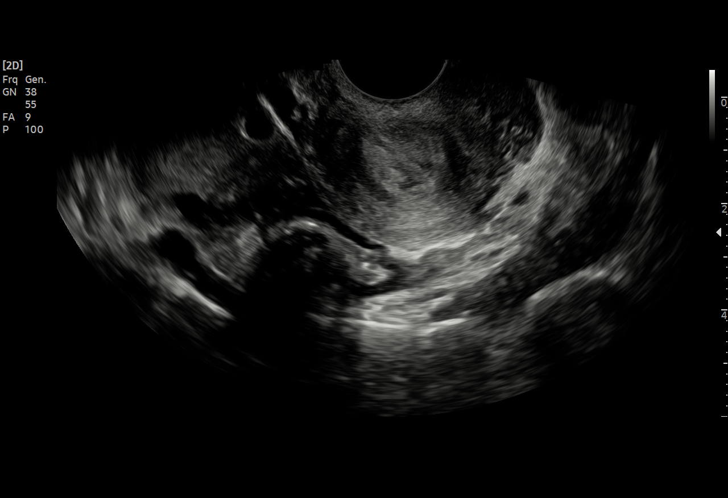
[im 45/72]
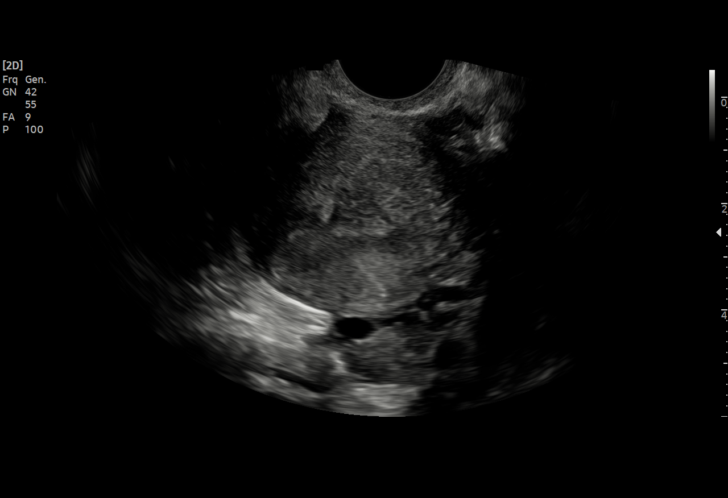
[im 51/72]
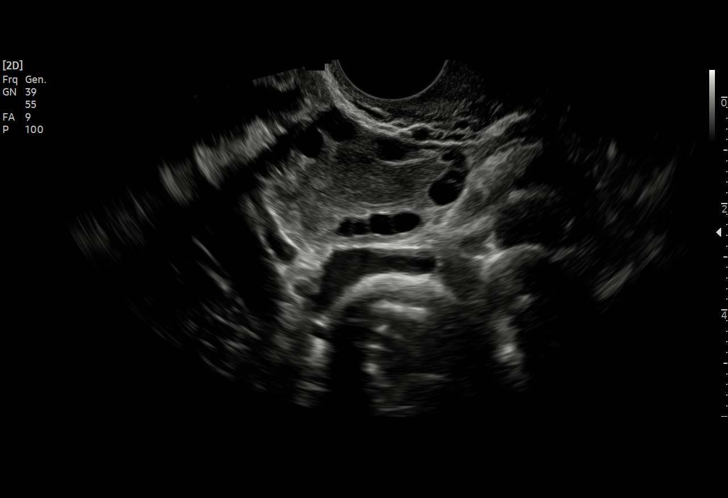
[im 57/72]
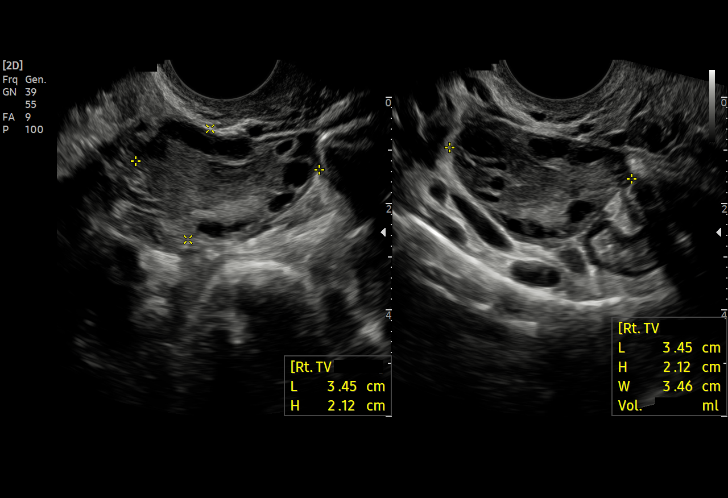
[im 60/72]
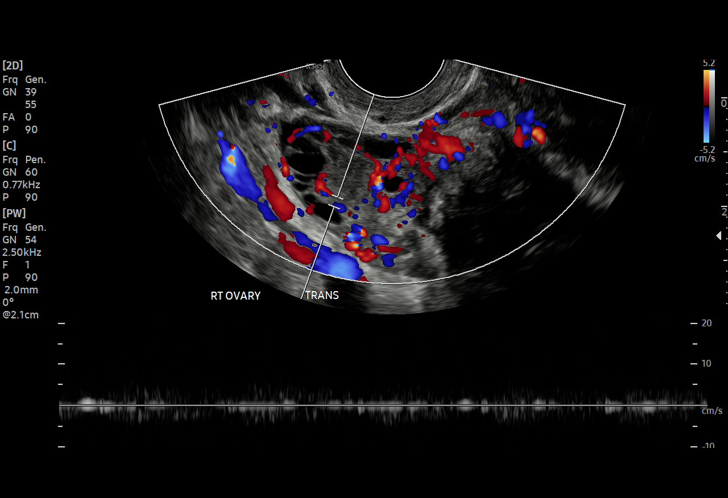
[im 66/72]
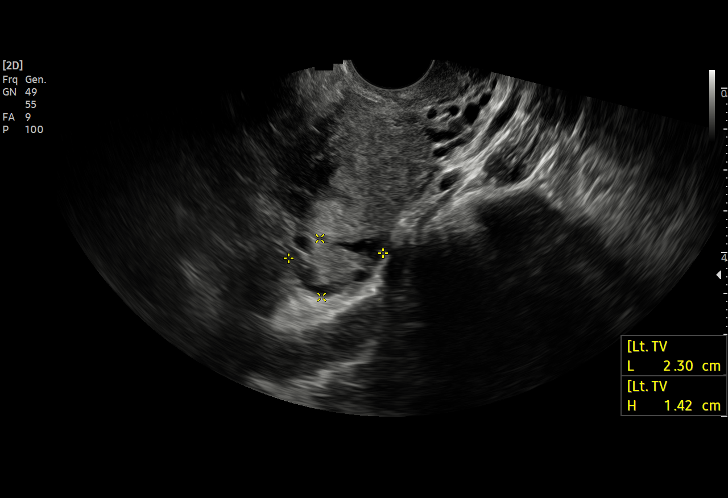
[im 72/72]
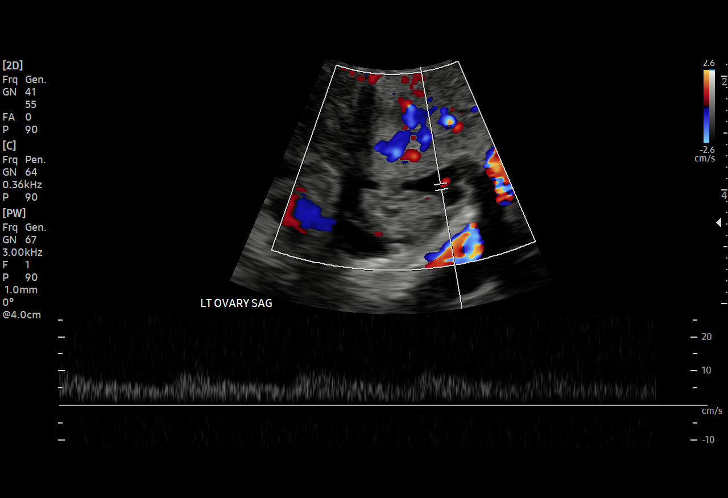

[15 of 25 positions shown; findings below may reference images not displayed]

FINDINGS: Uterus

Measurements: 7.2 x 3.5 x 4.1 cm = volume: 53.4 mL. Uterus is
anteverted. No discrete fibroid or other mass.

Endometrium

Thickness: 8 mm. No focal abnormality. Trace simple fluid noted
within the endocervical canal.

Right ovary

Measurements: 3.5 x 2.1 x 3.5 cm = volume: 13.2 mL. Normal
appearance/no adnexal mass.

Left ovary

Measurements: 3.2 x 1.9 x 2.0 cm = volume: 6.2 mL. Normal
appearance/no adnexal mass.

Pulsed Doppler evaluation of both ovaries demonstrates normal
low-resistance arterial and venous waveforms.

Other findings

No abnormal free fluid.
IMPRESSION: Normal pelvic ultrasound. No evidence for torsion or other acute
abnormality.

## 2021-11-16 IMAGING — US US TRANSVAGINAL NON-OB
1 series · 15 of 25 positions shown · non-contrast
Comparison: None.

CLINICAL DATA: 24-year-old female with pelvic pain.

EXAM:
TRANSABDOMINAL AND TRANSVAGINAL ULTRASOUND OF PELVIS
DOPPLER ULTRASOUND OF OVARIES
TECHNIQUE: Both transabdominal and transvaginal ultrasound examinations of the
pelvis were performed. Transabdominal technique was performed for
global imaging of the pelvis including uterus, ovaries, adnexal
regions, and pelvic cul-de-sac.
It was necessary to proceed with endovaginal exam following the
transabdominal exam to visualize the endometrium and ovaries. Color
and duplex Doppler ultrasound was utilized to evaluate blood flow to
the ovaries.

[Series 1: us art/ven flow abd pelv doppl mc & wl · 15 of 57 slices shown]
[im 1/57]
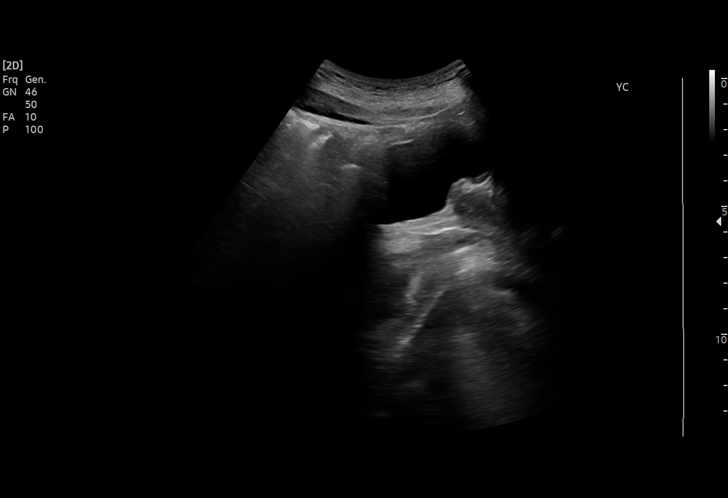
[im 5/57]
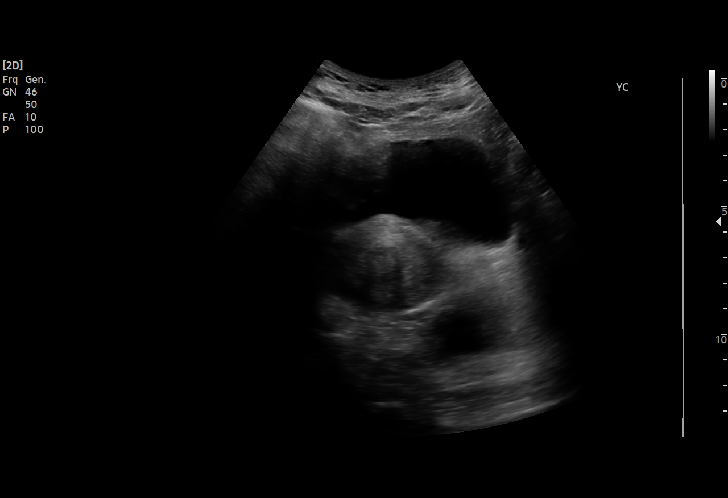
[im 10/57]
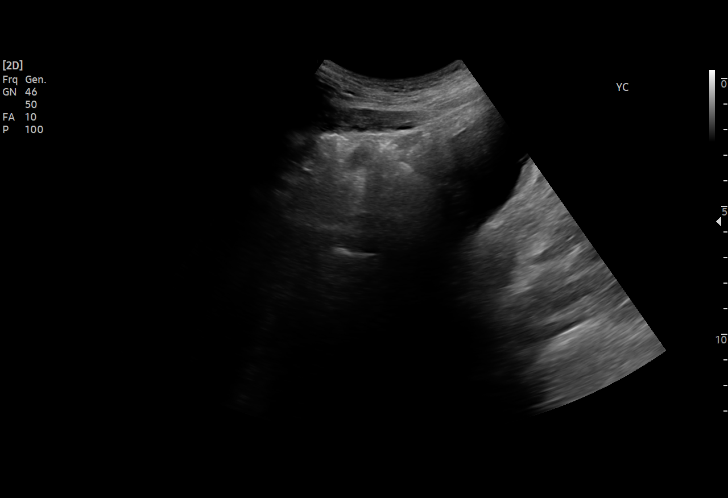
[im 12/57]
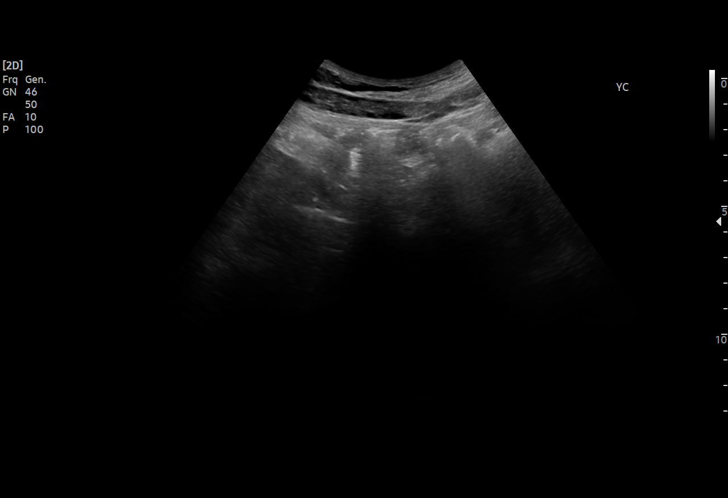
[im 17/57]
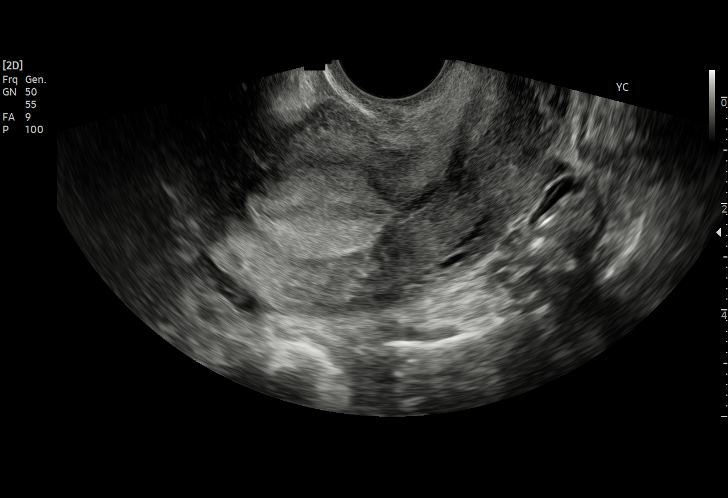
[im 22/57]
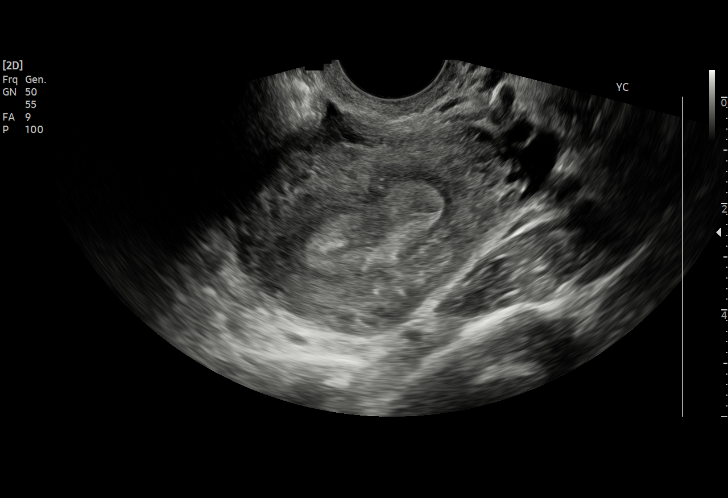
[im 24/57]
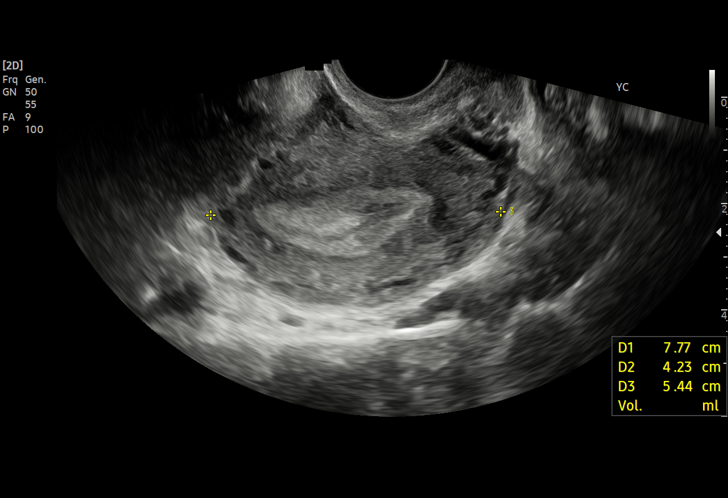
[im 29/57]
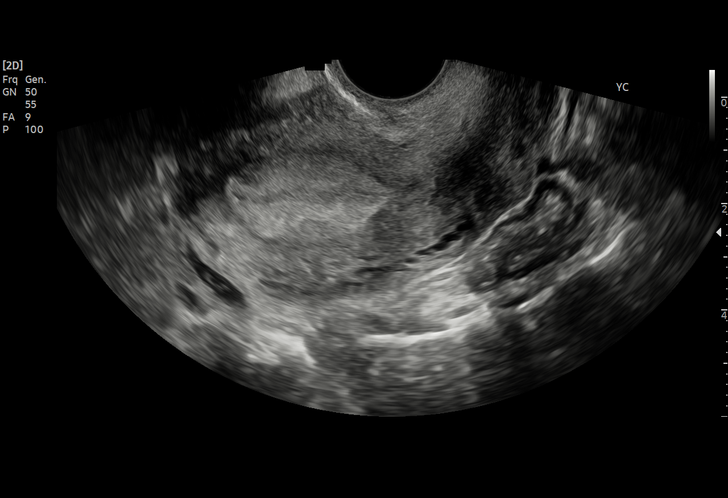
[im 33/57]
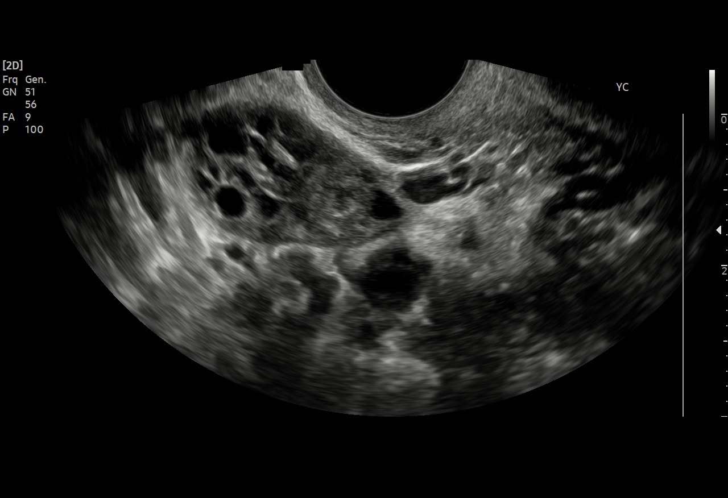
[im 36/57]
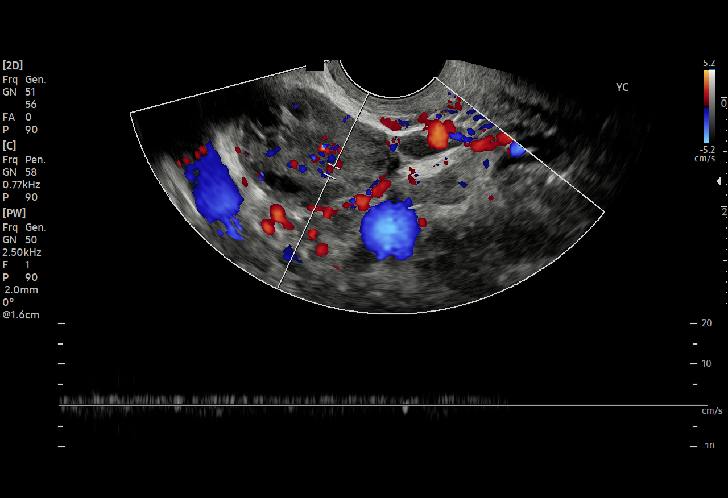
[im 40/57]
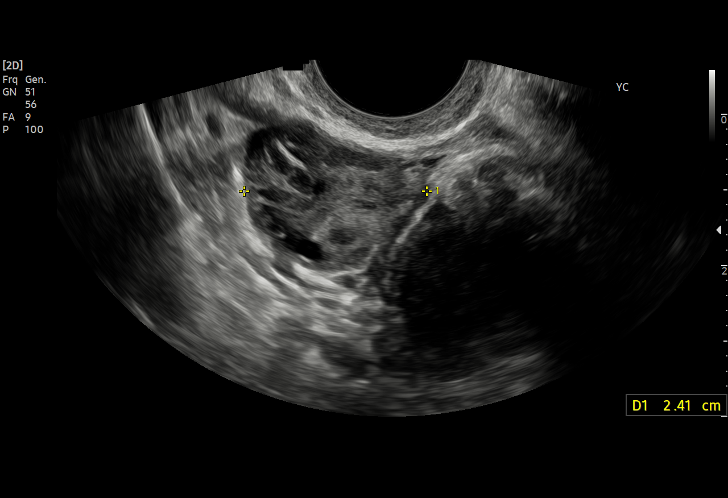
[im 45/57]
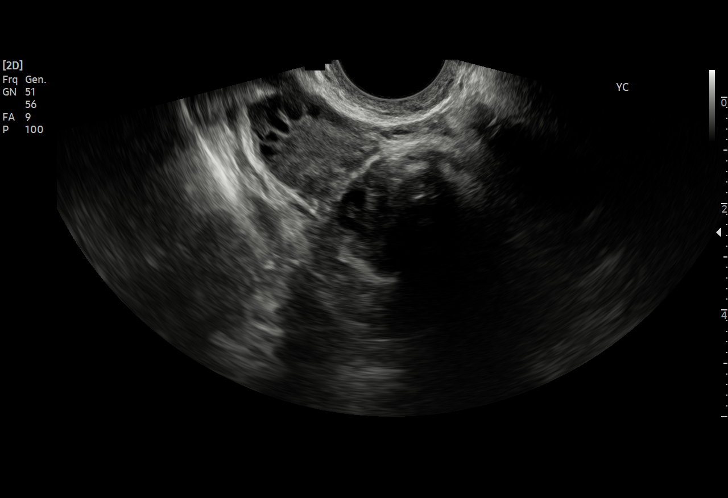
[im 47/57]
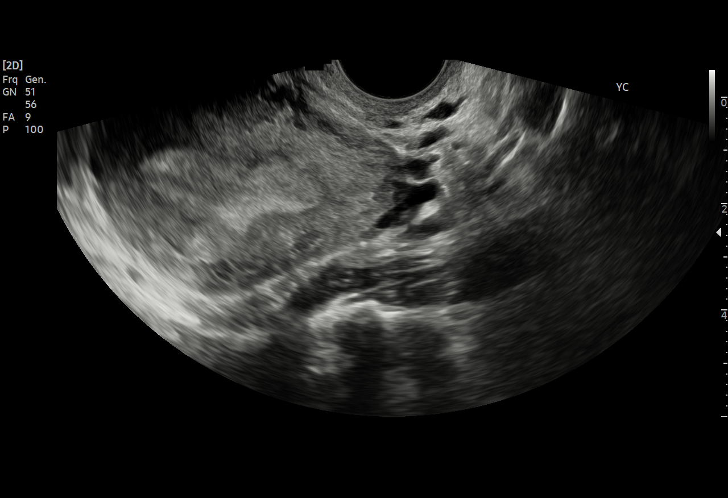
[im 52/57]
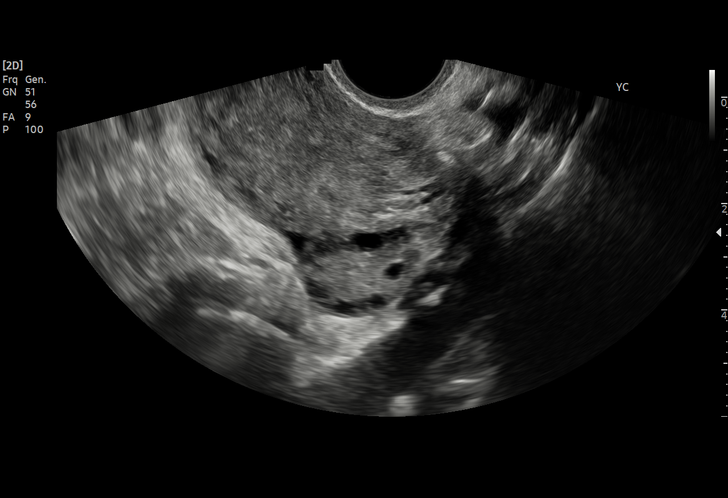
[im 57/57]
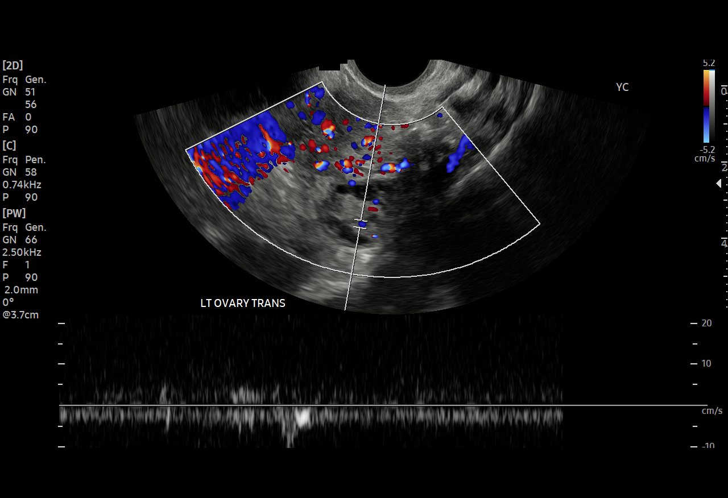

[15 of 25 positions shown; findings below may reference images not displayed]

FINDINGS: Uterus

Measurements: 7.8 x 4.2 x 5.4 cm = volume: 94 mL. No fibroids or
other mass visualized.

Endometrium

Thickness: 16 mm.  No focal abnormality visualized.

Right ovary

Measurements: 3.1 x 1.9 x 2.4 cm = volume: 7.5 mL. Normal
appearance/no adnexal mass.

Left ovary

Measurements: 2.5 x 1.6 x 2.9 cm = volume: 5.0 mL. Normal
appearance/no adnexal mass.

Pulsed Doppler evaluation of both ovaries demonstrates normal
low-resistance arterial and venous waveforms.

Other findings

No abnormal free fluid.
IMPRESSION: Unremarkable pelvic ultrasound.

## 2021-11-16 IMAGING — US US PELVIS COMPLETE
1 series · 15 of 25 positions shown · non-contrast
Comparison: None.

CLINICAL DATA: 24-year-old female with pelvic pain.

EXAM:
TRANSABDOMINAL AND TRANSVAGINAL ULTRASOUND OF PELVIS
DOPPLER ULTRASOUND OF OVARIES
TECHNIQUE: Both transabdominal and transvaginal ultrasound examinations of the
pelvis were performed. Transabdominal technique was performed for
global imaging of the pelvis including uterus, ovaries, adnexal
regions, and pelvic cul-de-sac.
It was necessary to proceed with endovaginal exam following the
transabdominal exam to visualize the endometrium and ovaries. Color
and duplex Doppler ultrasound was utilized to evaluate blood flow to
the ovaries.

[Series 1: us art/ven flow abd pelv doppl mc & wl · 15 of 57 slices shown]
[im 1/57]
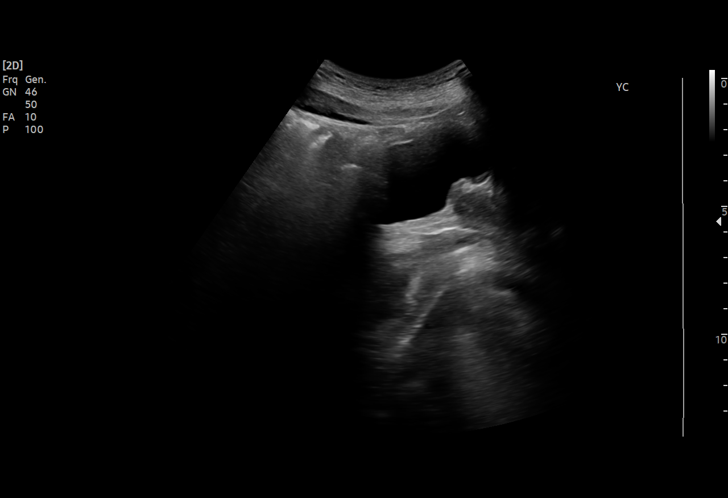
[im 5/57]
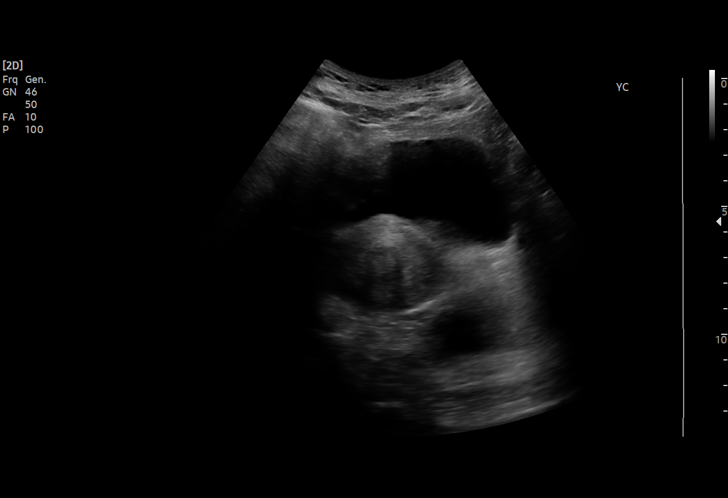
[im 10/57]
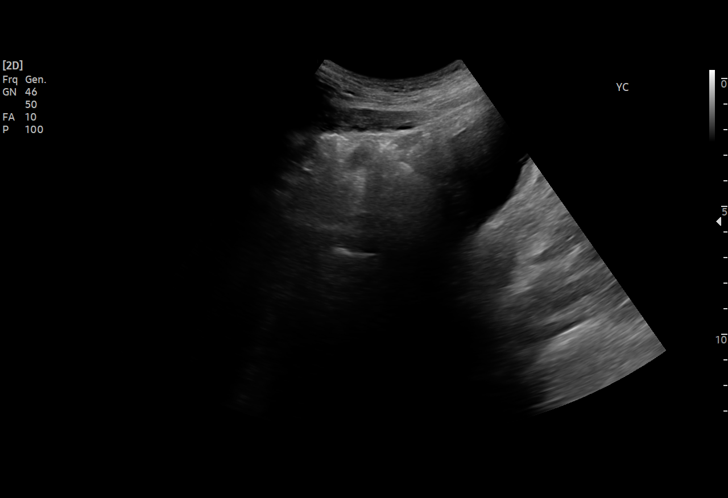
[im 12/57]
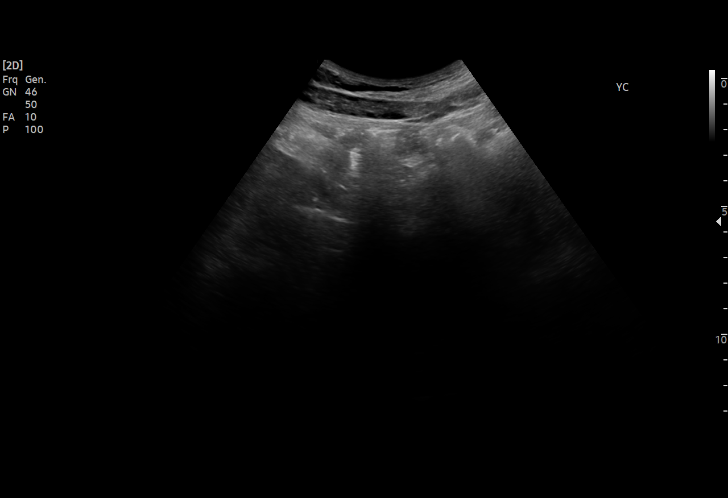
[im 17/57]
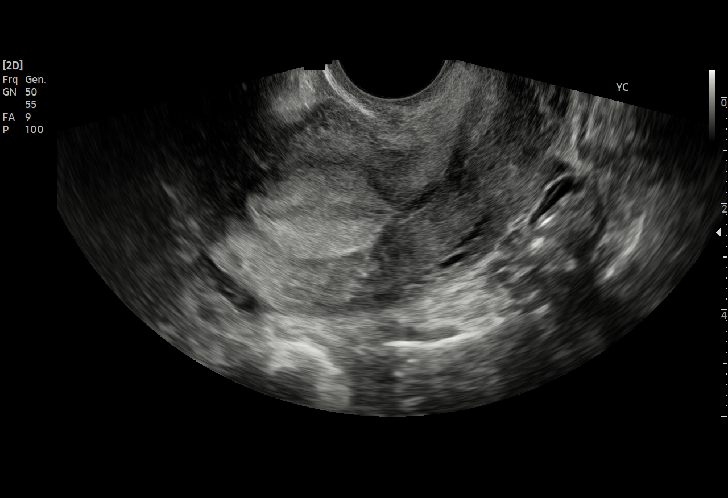
[im 22/57]
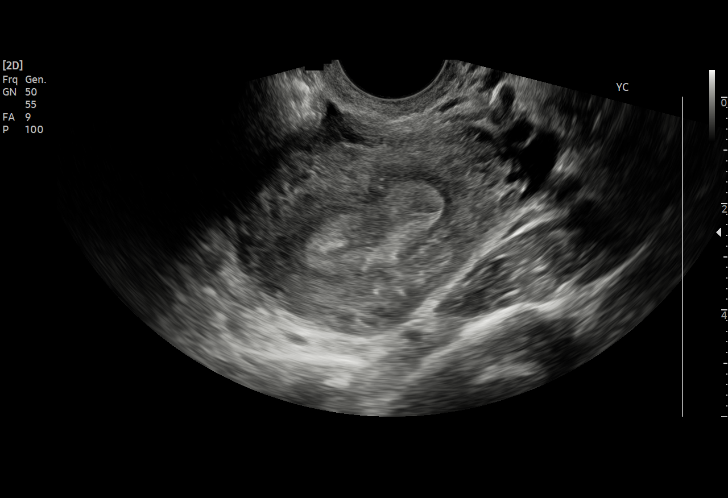
[im 24/57]
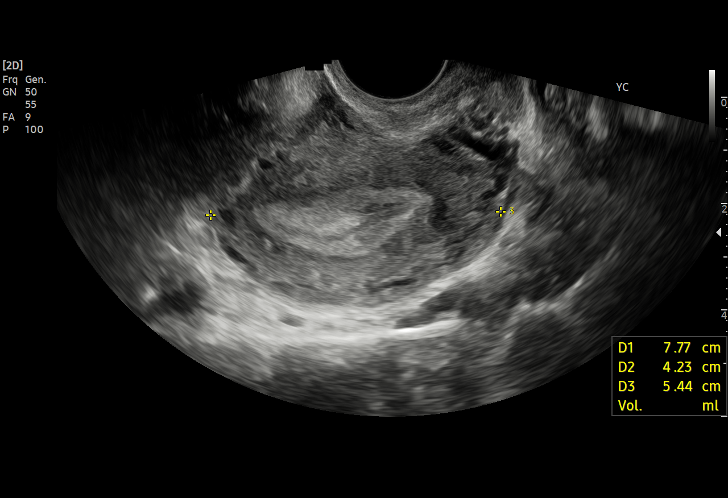
[im 29/57]
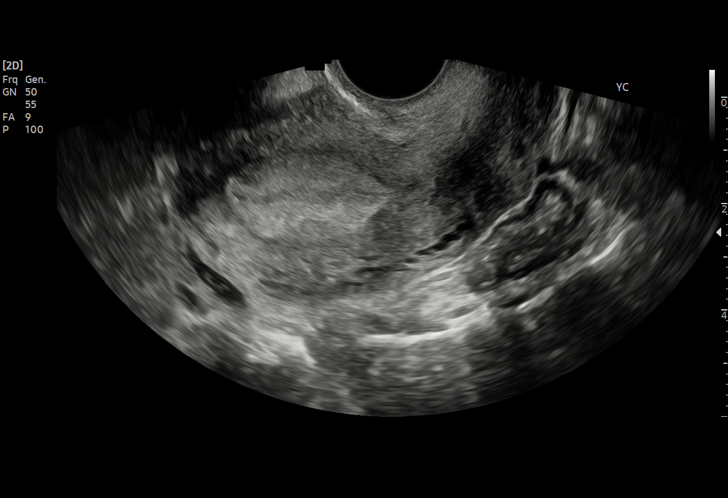
[im 33/57]
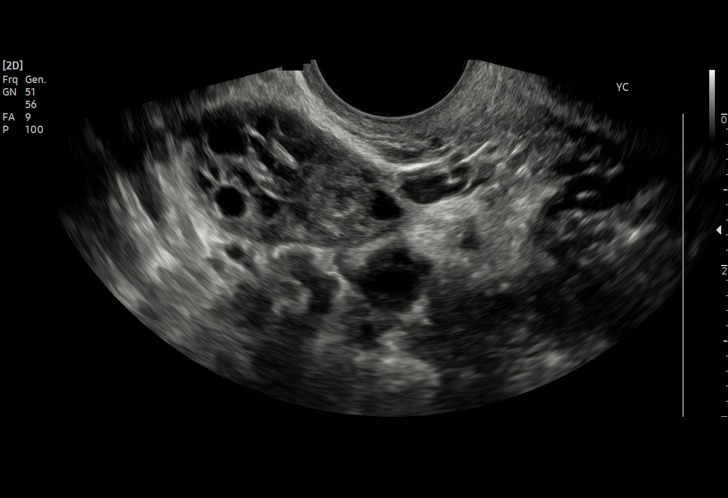
[im 36/57]
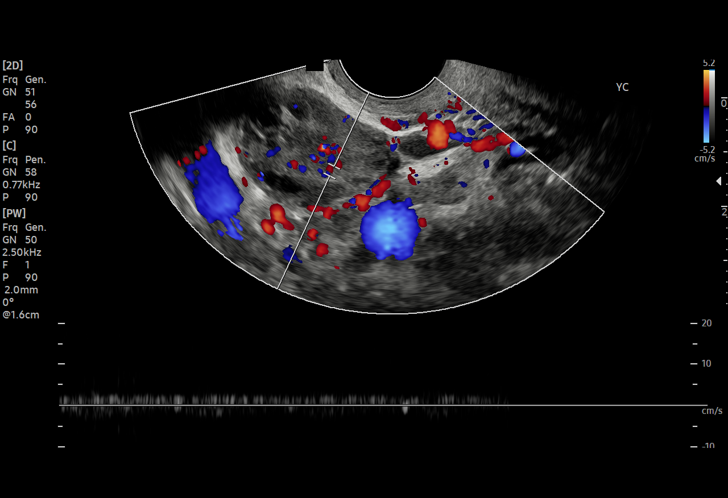
[im 40/57]
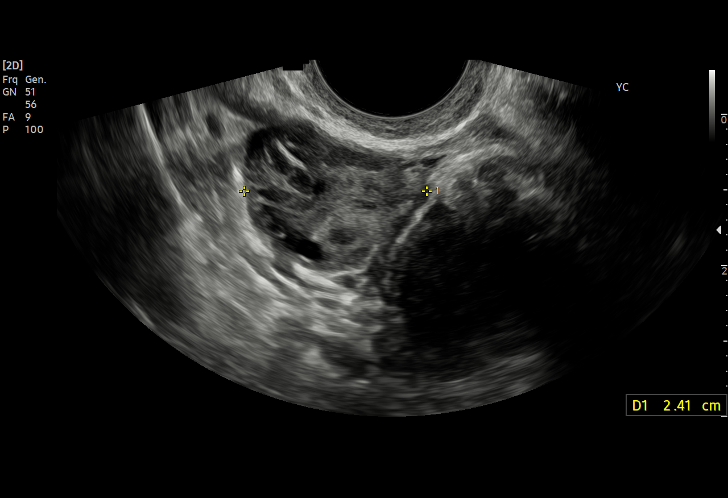
[im 45/57]
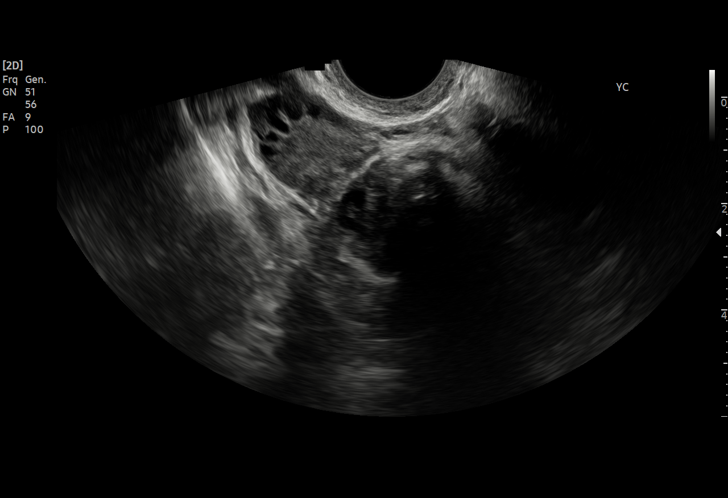
[im 47/57]
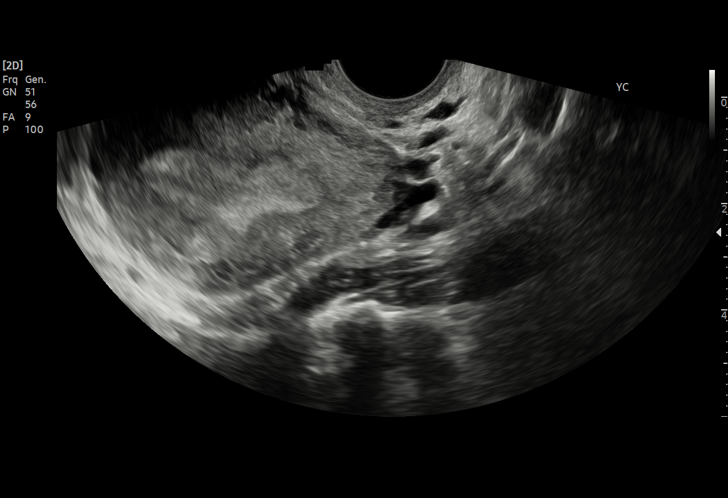
[im 52/57]
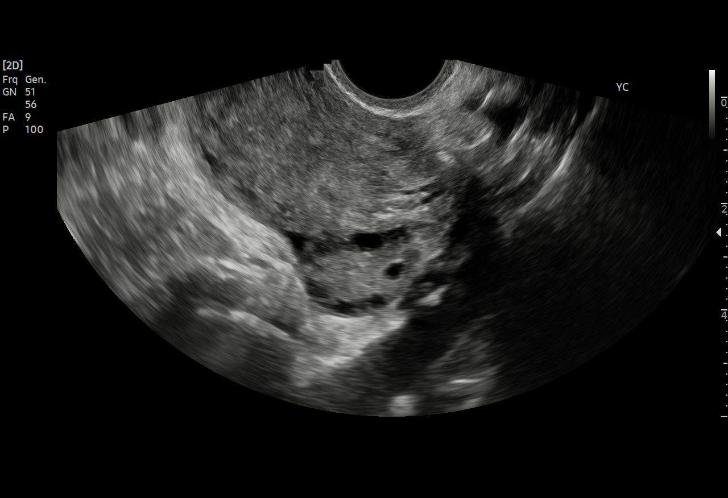
[im 57/57]
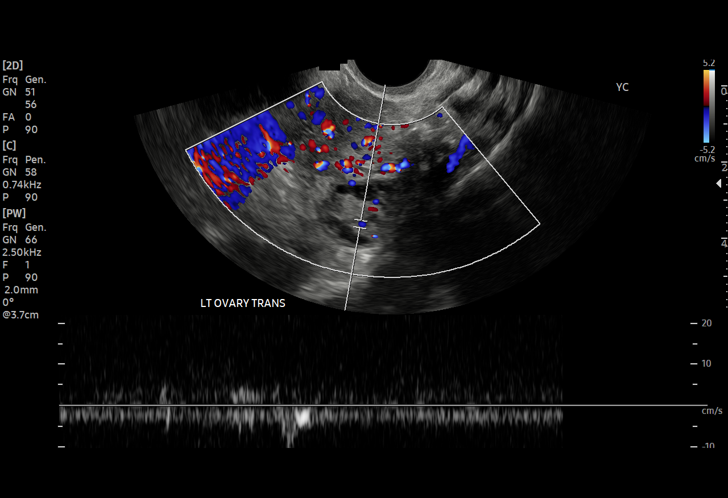

[15 of 25 positions shown; findings below may reference images not displayed]

FINDINGS: Uterus

Measurements: 7.8 x 4.2 x 5.4 cm = volume: 94 mL. No fibroids or
other mass visualized.

Endometrium

Thickness: 16 mm.  No focal abnormality visualized.

Right ovary

Measurements: 3.1 x 1.9 x 2.4 cm = volume: 7.5 mL. Normal
appearance/no adnexal mass.

Left ovary

Measurements: 2.5 x 1.6 x 2.9 cm = volume: 5.0 mL. Normal
appearance/no adnexal mass.

Pulsed Doppler evaluation of both ovaries demonstrates normal
low-resistance arterial and venous waveforms.

Other findings

No abnormal free fluid.
IMPRESSION: Unremarkable pelvic ultrasound.
